# Patient Record
Sex: Male | Born: 1995 | Race: White | Hispanic: No | Marital: Single
Health system: Midwestern US, Community
[De-identification: ages and names within clinical notes are randomized; demographics above are authoritative.]

## PROBLEM LIST (undated history)

## (undated) DIAGNOSIS — S56429A Laceration of extensor muscle, fascia and tendon of unspecified finger at forearm level, initial encounter: Secondary | ICD-10-CM

## (undated) DIAGNOSIS — R0989 Other specified symptoms and signs involving the circulatory and respiratory systems: Secondary | ICD-10-CM

## (undated) DIAGNOSIS — R05 Cough: Secondary | ICD-10-CM

## (undated) DIAGNOSIS — S61209A Unspecified open wound of unspecified finger without damage to nail, initial encounter: Secondary | ICD-10-CM

---
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
## 2017-05-26 DIAGNOSIS — S61209A Unspecified open wound of unspecified finger without damage to nail, initial encounter: Secondary | ICD-10-CM

## 2017-05-26 HISTORY — DX: Unspecified open wound of unspecified finger without damage to nail, initial encounter: S61.209A

## 2017-06-22 ENCOUNTER — Other Ambulatory Visit: Payer: Self-pay

## 2017-06-22 ENCOUNTER — Other Ambulatory Visit: Payer: Self-pay | Admitting: Orthopedic Surgery

## 2017-06-22 ENCOUNTER — Encounter (HOSPITAL_BASED_OUTPATIENT_CLINIC_OR_DEPARTMENT_OTHER): Payer: Self-pay | Admitting: *Deleted

## 2017-06-22 DIAGNOSIS — R0989 Other specified symptoms and signs involving the circulatory and respiratory systems: Secondary | ICD-10-CM

## 2017-06-22 DIAGNOSIS — R059 Cough, unspecified: Secondary | ICD-10-CM

## 2017-06-22 HISTORY — DX: Cough, unspecified: R05.9

## 2017-06-22 HISTORY — DX: Other specified symptoms and signs involving the circulatory and respiratory systems: R09.89

## 2017-06-25 ENCOUNTER — Ambulatory Visit (HOSPITAL_BASED_OUTPATIENT_CLINIC_OR_DEPARTMENT_OTHER)
Admission: RE | Admit: 2017-06-25 | Discharge: 2017-06-25 | Disposition: A | Discharge status: Home / Self Care | Payer: BLUE CROSS/BLUE SHIELD | Source: Ambulatory Visit | Attending: Orthopedic Surgery | Admitting: Orthopedic Surgery

## 2017-06-25 ENCOUNTER — Ambulatory Visit (HOSPITAL_BASED_OUTPATIENT_CLINIC_OR_DEPARTMENT_OTHER): Payer: BLUE CROSS/BLUE SHIELD | Admitting: Anesthesiology

## 2017-06-25 ENCOUNTER — Other Ambulatory Visit: Payer: Self-pay

## 2017-06-25 ENCOUNTER — Encounter (HOSPITAL_BASED_OUTPATIENT_CLINIC_OR_DEPARTMENT_OTHER)
Admission: RE | Disposition: A | Discharge status: Home / Self Care | Payer: Self-pay | Source: Ambulatory Visit | Attending: Orthopedic Surgery

## 2017-06-25 ENCOUNTER — Encounter (HOSPITAL_BASED_OUTPATIENT_CLINIC_OR_DEPARTMENT_OTHER): Payer: Self-pay | Admitting: *Deleted

## 2017-06-25 DIAGNOSIS — F17210 Nicotine dependence, cigarettes, uncomplicated: Secondary | ICD-10-CM | POA: Insufficient documentation

## 2017-06-25 DIAGNOSIS — R05 Cough: Secondary | ICD-10-CM | POA: Diagnosis not present

## 2017-06-25 DIAGNOSIS — S66320A Laceration of extensor muscle, fascia and tendon of right index finger at wrist and hand level, initial encounter: Secondary | ICD-10-CM | POA: Diagnosis not present

## 2017-06-25 DIAGNOSIS — R0989 Other specified symptoms and signs involving the circulatory and respiratory systems: Secondary | ICD-10-CM | POA: Insufficient documentation

## 2017-06-25 DIAGNOSIS — X58XXXA Exposure to other specified factors, initial encounter: Secondary | ICD-10-CM | POA: Diagnosis not present

## 2017-06-25 DIAGNOSIS — S61210A Laceration without foreign body of right index finger without damage to nail, initial encounter: Secondary | ICD-10-CM | POA: Diagnosis present

## 2017-06-25 DIAGNOSIS — Y939 Activity, unspecified: Secondary | ICD-10-CM | POA: Diagnosis not present

## 2017-06-25 HISTORY — DX: Other specified symptoms and signs involving the circulatory and respiratory systems: R09.89

## 2017-06-25 HISTORY — DX: Cough: R05

## 2017-06-25 HISTORY — PX: REPAIR EXTENSOR TENDON: SHX5382

## 2017-06-25 HISTORY — DX: Unspecified open wound of unspecified finger without damage to nail, initial encounter: S61.209A

## 2017-06-25 HISTORY — DX: Laceration of extensor muscle, fascia and tendon of unspecified finger at forearm level, initial encounter: S56.429A

## 2017-06-25 SURGERY — REPAIR, TENDON, EXTENSOR
Anesthesia: Regional | Site: Finger | Laterality: Right

## 2017-06-25 MED ORDER — MIDAZOLAM HCL 2 MG/2ML IJ SOLN
INTRAMUSCULAR | Status: AC
Start: 2017-06-25 — End: ?
  Filled 2017-06-25: qty 2

## 2017-06-25 MED ORDER — MEPERIDINE HCL 25 MG/ML IJ SOLN: 6.2500 mg | INTRAMUSCULAR | Status: DC | PRN

## 2017-06-25 MED ORDER — ONDANSETRON HCL 4 MG/2ML IJ SOLN
INTRAMUSCULAR | Status: AC
Start: 2017-06-25 — End: ?
  Filled 2017-06-25: qty 2

## 2017-06-25 MED ORDER — ONDANSETRON HCL 4 MG/2ML IJ SOLN
INTRAMUSCULAR | Status: AC
  Filled 2017-06-25: qty 2

## 2017-06-25 MED ORDER — PROMETHAZINE HCL 25 MG/ML IJ SOLN: 6.2500 mg | INTRAMUSCULAR | Status: DC | PRN

## 2017-06-25 MED ORDER — PROPOFOL 10 MG/ML IV BOLUS
INTRAVENOUS | Status: DC | PRN
  Administered 2017-06-25: 40 mg via INTRAVENOUS

## 2017-06-25 MED ORDER — OXYCODONE HCL 5 MG PO TABS: 5.0000 mg | ORAL_TABLET | Freq: Once | ORAL | Status: DC | PRN

## 2017-06-25 MED ORDER — FENTANYL CITRATE (PF) 100 MCG/2ML IJ SOLN
INTRAMUSCULAR | Status: AC
  Filled 2017-06-25: qty 2

## 2017-06-25 MED ORDER — HYDROMORPHONE HCL 1 MG/ML IJ SOLN
0.2500 mg | INTRAMUSCULAR | Status: DC | PRN
Start: 2017-06-25 — End: 2017-06-25

## 2017-06-25 MED ORDER — LACTATED RINGERS IV SOLN
INTRAVENOUS | Status: DC
  Administered 2017-06-25: 14:00:00 via INTRAVENOUS

## 2017-06-25 MED ORDER — OXYCODONE HCL 5 MG/5ML PO SOLN: 5.0000 mg | Freq: Once | ORAL | Status: DC | PRN

## 2017-06-25 MED ORDER — DEXAMETHASONE SODIUM PHOSPHATE 10 MG/ML IJ SOLN
INTRAMUSCULAR | Status: AC
  Filled 2017-06-25: qty 1

## 2017-06-25 MED ORDER — CEFAZOLIN SODIUM-DEXTROSE 2-4 GM/100ML-% IV SOLN
INTRAVENOUS | Status: AC
  Filled 2017-06-25: qty 100

## 2017-06-25 MED ORDER — MIDAZOLAM HCL 2 MG/2ML IJ SOLN
1.0000 mg | INTRAMUSCULAR | Status: DC | PRN
  Administered 2017-06-25: 2 mg via INTRAVENOUS

## 2017-06-25 MED ORDER — LACTATED RINGERS IV SOLN: INTRAVENOUS | Status: DC

## 2017-06-25 MED ORDER — HYDROCODONE-ACETAMINOPHEN 5-325 MG PO TABS: 1.0000 | ORAL_TABLET | Freq: Four times a day (QID) | ORAL | 0 refills | Status: AC | PRN

## 2017-06-25 MED ORDER — LIDOCAINE HCL (PF) 0.5 % IJ SOLN
INTRAMUSCULAR | Status: DC | PRN
  Administered 2017-06-25: 30 mL via INTRAVENOUS

## 2017-06-25 MED ORDER — FENTANYL CITRATE (PF) 100 MCG/2ML IJ SOLN
50.0000 ug | INTRAMUSCULAR | Status: DC | PRN
  Administered 2017-06-25: 100 ug via INTRAVENOUS

## 2017-06-25 MED ORDER — CHLORHEXIDINE GLUCONATE 4 % EX LIQD: 60.0000 mL | Freq: Once | CUTANEOUS | Status: DC

## 2017-06-25 MED ORDER — BUPIVACAINE HCL (PF) 0.25 % IJ SOLN
INTRAMUSCULAR | Status: DC | PRN
  Administered 2017-06-25: 8 mL

## 2017-06-25 MED ORDER — PROPOFOL 500 MG/50ML IV EMUL
INTRAVENOUS | Status: DC | PRN
  Administered 2017-06-25: 100 ug/kg/min via INTRAVENOUS

## 2017-06-25 MED ORDER — PROPOFOL 10 MG/ML IV BOLUS
INTRAVENOUS | Status: AC
  Filled 2017-06-25: qty 20

## 2017-06-25 MED ORDER — SCOPOLAMINE 1 MG/3DAYS TD PT72: 1.0000 | MEDICATED_PATCH | Freq: Once | TRANSDERMAL | Status: DC | PRN

## 2017-06-25 MED ORDER — CEFAZOLIN SODIUM-DEXTROSE 2-4 GM/100ML-% IV SOLN
2.0000 g | INTRAVENOUS | Status: AC
  Administered 2017-06-25: 2 g via INTRAVENOUS

## 2017-06-25 SURGICAL SUPPLY — 69 items
BAG DECANTER FOR FLEXI CONT (MISCELLANEOUS) IMPLANT
BLADE MINI RND TIP GREEN BEAV (BLADE) IMPLANT
BLADE SURG 15 STRL LF DISP TIS (BLADE) ×1 IMPLANT
BLADE SURG 15 STRL SS (BLADE) ×1
BNDG COHESIVE 3X5 TAN STRL LF (GAUZE/BANDAGES/DRESSINGS) ×2 IMPLANT
BNDG ESMARK 4X9 LF (GAUZE/BANDAGES/DRESSINGS) IMPLANT
BNDG GAUZE ELAST 4 BULKY (GAUZE/BANDAGES/DRESSINGS) ×2 IMPLANT
CHLORAPREP W/TINT 26ML (MISCELLANEOUS) ×2 IMPLANT
CORD BIPOLAR FORCEPS 12FT (ELECTRODE) ×2 IMPLANT
COTTONBALL LRG STERILE PKG (GAUZE/BANDAGES/DRESSINGS) IMPLANT
COVER BACK TABLE 60X90IN (DRAPES) ×2 IMPLANT
COVER MAYO STAND STRL (DRAPES) ×2 IMPLANT
CUFF TOURNIQUET SINGLE 18IN (TOURNIQUET CUFF) ×2 IMPLANT
DECANTER SPIKE VIAL GLASS SM (MISCELLANEOUS) IMPLANT
DRAIN TLS ROUND 10FR (DRAIN) IMPLANT
DRAPE EXTREMITY T 121X128X90 (DRAPE) ×2 IMPLANT
DRAPE OEC MINIVIEW 54X84 (DRAPES) IMPLANT
DRAPE SURG 17X23 STRL (DRAPES) ×2 IMPLANT
GAUZE SPONGE 4X4 12PLY STRL (GAUZE/BANDAGES/DRESSINGS) ×2 IMPLANT
GAUZE SPONGE 4X4 16PLY XRAY LF (GAUZE/BANDAGES/DRESSINGS) IMPLANT
GAUZE XEROFORM 1X8 LF (GAUZE/BANDAGES/DRESSINGS) ×2 IMPLANT
GLOVE BIOGEL PI IND STRL 8.5 (GLOVE) ×1 IMPLANT
GLOVE BIOGEL PI INDICATOR 8.5 (GLOVE) ×1
GLOVE SURG ORTHO 8.0 STRL STRW (GLOVE) ×2 IMPLANT
GOWN STRL REUS W/ TWL LRG LVL3 (GOWN DISPOSABLE) ×1 IMPLANT
GOWN STRL REUS W/TWL LRG LVL3 (GOWN DISPOSABLE) ×1
GOWN STRL REUS W/TWL XL LVL3 (GOWN DISPOSABLE) ×2 IMPLANT
K-WIRE .035X4 (WIRE) IMPLANT
LOOP VESSEL MAXI BLUE (MISCELLANEOUS) IMPLANT
NEEDLE HYPO 22GX1.5 SAFETY (NEEDLE) IMPLANT
NEEDLE KEITH (NEEDLE) IMPLANT
NEEDLE PRECISIONGLIDE 27X1.5 (NEEDLE) ×2 IMPLANT
NS IRRIG 1000ML POUR BTL (IV SOLUTION) ×2 IMPLANT
PACK BASIN DAY SURGERY FS (CUSTOM PROCEDURE TRAY) ×2 IMPLANT
PAD CAST 3X4 CTTN HI CHSV (CAST SUPPLIES) ×1 IMPLANT
PADDING CAST ABS 3INX4YD NS (CAST SUPPLIES)
PADDING CAST ABS 4INX4YD NS (CAST SUPPLIES) ×1
PADDING CAST ABS COTTON 3X4 (CAST SUPPLIES) IMPLANT
PADDING CAST ABS COTTON 4X4 ST (CAST SUPPLIES) ×1 IMPLANT
PADDING CAST COTTON 3X4 STRL (CAST SUPPLIES) ×1
SLEEVE SCD COMPRESS KNEE MED (MISCELLANEOUS) IMPLANT
SPLINT PLASTER CAST XFAST 3X15 (CAST SUPPLIES) ×10 IMPLANT
SPLINT PLASTER XTRA FASTSET 3X (CAST SUPPLIES) ×10
STOCKINETTE 4X48 STRL (DRAPES) ×2 IMPLANT
SUT CHROMIC 5 0 P 3 (SUTURE) IMPLANT
SUT ETHIBOND 3-0 V-5 (SUTURE) IMPLANT
SUT ETHILON 4 0 PS 2 18 (SUTURE) ×2 IMPLANT
SUT ETHILON 5 0 PC 1 (SUTURE) ×2 IMPLANT
SUT FIBERWIRE 2-0 18 17.9 3/8 (SUTURE)
SUT FIBERWIRE 4-0 18 TAPR NDL (SUTURE)
SUT MERSILENE 2.0 SH NDLE (SUTURE) IMPLANT
SUT MERSILENE 3 0 FS 1 (SUTURE) IMPLANT
SUT MERSILENE 4 0 P 3 (SUTURE) ×2 IMPLANT
SUT PROLENE 2 0 SH DA (SUTURE) IMPLANT
SUT SILK 2 0 PERMA HAND 18 BK (SUTURE) IMPLANT
SUT SILK 4 0 PS 2 (SUTURE) IMPLANT
SUT STEEL 3 0 (SUTURE) IMPLANT
SUT VIC AB 3-0 PS1 18 (SUTURE)
SUT VIC AB 3-0 PS1 18XBRD (SUTURE) IMPLANT
SUT VIC AB 4-0 P-3 18XBRD (SUTURE) IMPLANT
SUT VIC AB 4-0 P3 18 (SUTURE)
SUT VICRYL 4-0 PS2 18IN ABS (SUTURE) IMPLANT
SUTURE FIBERWR 2-0 18 17.9 3/8 (SUTURE) IMPLANT
SUTURE FIBERWR 4-0 18 TAPR NDL (SUTURE) IMPLANT
SYR BULB 3OZ (MISCELLANEOUS) ×2 IMPLANT
SYR CONTROL 10ML LL (SYRINGE) ×2 IMPLANT
TOWEL OR 17X24 6PK STRL BLUE (TOWEL DISPOSABLE) ×4 IMPLANT
TUBE FEEDING ENTERAL 5FR 16IN (TUBING) IMPLANT
UNDERPAD 30X30 (UNDERPADS AND DIAPERS) ×2 IMPLANT

## 2017-06-25 NOTE — Brief Op Note (Signed)
06/25/2017  3:33 PM  PATIENT:  Tony Gould  21 y.o. male  PRE-OPERATIVE DIAGNOSIS:  laceration extensor tendon right index finger  POST-OPERATIVE DIAGNOSIS:  Laceration extensor tendon right index finger  PROCEDURE:  Procedure(s): REPAIR EXTENSOR TENDON RIGHT INDEX FINGER (Right)  SURGEON:  Surgeon(s) and Role:    Cindee Salt* Ela Moffat, MD - Primary  PHYSICIAN ASSISTANT:   ASSISTANTS: none   ANESTHESIA:   local, regional and IV sedation  EBL:  none  BLOOD ADMINISTERED:none  DRAINS: none   LOCAL MEDICATIONS USED:  BUPIVICAINE   SPECIMEN:  No Specimen  DISPOSITION OF SPECIMEN:  N/A  COUNTS:  YES  TOURNIQUET:   Total Tourniquet Time Documented: Forearm (Right) - 26 minutes Total: Forearm (Right) - 26 minutes   DICTATION: .Other Dictation: Dictation Number [id number]  PLAN OF CARE: Discharge to home after PACU  PATIENT DISPOSITION:  PACU - hemodynamically stable.

## 2017-06-25 NOTE — H&P (Signed)
  Tony Gould is an 21 y.o. male.   Chief Complaint:laceration right index finger HPI: Tony Gould is a 21 year old right-hand-dominant male referred by [facility] for consultation regarding a laceration he sustained to his right index finger. The injury occurred on Christmas night. He was seen in the emergency room and sutured. He was told he had an extensor tendon laceration was placed on Keflex and a pain medicine. He is complaining only of a minimal throbbing pain at the present time. He has been referred. Lacerations on the dorsal aspect of his finger from MP to PIP joint. He has no prior history of injury. He has no history of diabetes thyroid problems arthritis or gout. Family history is negative for each of these also.      Past Medical History:  Diagnosis Date  . Cough 06/22/2017  . Extensor tendon laceration of finger with open wound 05/2017   sutured - right index finger  . Runny nose 06/22/2017    History reviewed. No pertinent surgical history.  History reviewed. No pertinent family history. Social History:  reports that he has been smoking cigarettes.  He has smoked for the past 4.00 years. he has never used smokeless tobacco. He reports that he does not drink alcohol or use drugs.  Allergies: No Known Allergies  No medications prior to admission.    No results found for this or any previous visit (from the past 48 hour(s)).  No results found.   Pertinent items are noted in HPI.  Height 6' (1.829 m), weight 72.6 kg (160 lb).  General appearance: alert, cooperative and appears stated age Head: Normocephalic, without obvious abnormality Neck: no JVD Resp: clear to auscultation bilaterally Cardio: regular rate and rhythm, S1, S2 normal, no murmur, click, rub or gallop GI: soft, non-tender; bowel sounds normal; no masses,  no organomegaly Extremities: laceration right index finger Pulses: 2+ and symmetric Skin: Skin color, texture, turgor normal. No rashes  or lesions Neurologic: Grossly normal Incision/Wound: sutured  Assessment/Plan Assessment:  1. Laceration of right index finger with tendon involvement, initial encounter    Plan: Discussed with them exploration repair of the extensor tendon due to the length of the laceration. This will be scheduled as an outpatient under regional anesthesia. Be at hand at work for approximately 7 weeks. He will require splinting afterwards. Questions are encouraged and answered to their satisfaction. They are advised to maintain the Keflex at the present time. He is advised to use Tylenol and ibuprofen for pain at the present time.      Tony Gould 06/25/2017, 1:23 PM

## 2017-06-25 NOTE — Discharge Instructions (Addendum)
[facility] Instructions Hand Surgery  Wound Care: Keep your hand elevated above the level of your heart.  Do not allow it to dangle by your side.  Keep the dressing dry and do not remove it unless your doctor advises you to do so.  He will usually change it at the time of your post-op visit.  Moving your fingers is advised to stimulate circulation but will depend on the site of your surgery.  If you have a splint applied, your doctor will advise you regarding movement.  Activity: Do not drive or operate machinery today.  Rest today and then you may return to your normal activity and work as indicated by your physician.  Diet:  Drink liquids today or eat a light diet.  You may resume a regular diet tomorrow.    General expectations: Pain for two to three days. Fingers may become slightly swollen.  Call your doctor if any of the following occur: Severe pain not relieved by pain medication. Elevated temperature. Dressing soaked with blood. Inability to move fingers. White or bluish color to fingers.   Post Anesthesia Home Care Instructions  Activity: Get plenty of rest for the remainder of the day. A responsible individual must stay with you for 24 hours following the procedure.  For the next 24 hours, DO NOT: -Drive a car -Advertising copywriterperate machinery -Drink alcoholic beverages -Take any medication unless instructed by your physician -Make any legal decisions or sign important papers.  Meals: Start with liquid foods such as gelatin or soup. Progress to regular foods as tolerated. Avoid greasy, spicy, heavy foods. If nausea and/or vomiting occur, drink only clear liquids until the nausea and/or vomiting subsides. Call your physician if vomiting continues.  Special Instructions/Symptoms: Your throat may feel dry or sore from the anesthesia or the breathing tube placed in your throat during surgery. If this causes discomfort, gargle with warm salt water. The discomfort should disappear within  24 hours.  If you had a scopolamine patch placed behind your ear for the management of post- operative nausea and/or vomiting:  1. The medication in the patch is effective for 72 hours, after which it should be removed.  Wrap patch in a tissue and discard in the trash. Wash hands thoroughly with soap and water. 2. You may remove the patch earlier than 72 hours if you experience unpleasant side effects which may include dry mouth, dizziness or visual disturbances. 3. Avoid touching the patch. Wash your hands with soap and water after contact with the patch.        Excuse from Work  Please excuse Tony Gould from work/school beginning 06/25/2017.He  may then return to work with the following restrictions: no use right hand for 7 weeks.

## 2017-06-25 NOTE — Op Note (Signed)
Dictation Number [id number]

## 2017-06-25 NOTE — Anesthesia Preprocedure Evaluation (Signed)
Anesthesia Evaluation  Patient identified by MRN, date of birth, ID band Patient awake    Reviewed: Allergy & Precautions, NPO status , Patient's Chart, lab work & pertinent test results  Airway Mallampati: I  TM Distance: >3 FB Neck ROM: Full    Dental  (+) Teeth Intact, Dental Advisory Given   Pulmonary Current Smoker,    breath sounds clear to auscultation       Cardiovascular negative cardio ROS   Rhythm:Regular Rate:Normal     Neuro/Psych negative neurological ROS  negative psych ROS   GI/Hepatic negative GI ROS, Neg liver ROS,   Endo/Other  negative endocrine ROS  Renal/GU negative Renal ROS     Musculoskeletal negative musculoskeletal ROS (+)   Abdominal Normal abdominal exam  (+)   Peds  Hematology negative hematology ROS (+)   Anesthesia Other Findings   Reproductive/Obstetrics                             Anesthesia Physical Anesthesia Plan  ASA: I  Anesthesia Plan: Bier Block and Bier Block-LIDOCAINE ONLY   Post-op Pain Management:    Induction: Intravenous  PONV Risk Score and Plan: 1 and Ondansetron and Propofol infusion  Airway Management Planned: Natural Airway and Nasal Cannula  Additional Equipment: None  Intra-op Plan:   Post-operative Plan:   Informed Consent: I have reviewed the patients History and Physical, chart, labs and discussed the procedure including the risks, benefits and alternatives for the proposed anesthesia with the patient or authorized representative who has indicated his/her understanding and acceptance.   Dental advisory given  Plan Discussed with: CRNA  Anesthesia Plan Comments:         Anesthesia Quick Evaluation

## 2017-06-25 NOTE — Transfer of Care (Signed)
Immediate Anesthesia Transfer of Care Note  Patient: Tony Gould  Procedure(s) Performed: REPAIR EXTENSOR TENDON RIGHT INDEX FINGER (Right Finger)  Patient Location: PACU  Anesthesia Type:MAC  Level of Consciousness: awake and alert   Airway & Oxygen Therapy: Patient Spontanous Breathing and Patient connected to face mask oxygen  Post-op Assessment: Report given to RN and Post -op Vital signs reviewed and stable  Post vital signs: Reviewed and stable  Last Vitals:  Vitals:   06/25/17 1345  BP: 119/71  Pulse: (!) 52  Resp: 20  Temp: 36.5 C  SpO2: 100%    Last Pain:  Vitals:   06/25/17 1345  TempSrc: Oral  PainSc:          Complications: No apparent anesthesia complications

## 2017-06-25 NOTE — Anesthesia Procedure Notes (Signed)
Anesthesia Regional Block: Bier block (IV Regional)   Pre-Anesthetic Checklist: ,, timeout performed, Correct Patient, Correct Site, Correct Laterality, Correct Procedure,, site marked, surgical consent,, at surgeon's request Needles:  Injection technique: Single-shot  Needle Type: Other      Needle Gauge: 20     Additional Needles:   Procedures:,,,,, intact distal pulses, Esmarch exsanguination, single tourniquet utilized,  Narrative:  Start time: 06/25/2017 2:59 PM End time: 06/25/2017 3:00 PM  Performed by: Personally

## 2017-06-26 NOTE — Op Note (Signed)
[name]:  Kluesner, Omarri               ACCOUNT NO.:  [id number]  MEDICAL RECORD NO.:  [id number]  LOCATION:                                 FACILITY:  PHYSICIAN:  Cindee SaltGary Delana Manganello, M.D.            DATE OF BIRTH:  DATE OF PROCEDURE:  06/25/2017 DATE OF DISCHARGE:                              OPERATIVE REPORT   PREOPERATIVE DIAGNOSIS:  Laceration of extensor tendon, right index finger.  POSTOPERATIVE DIAGNOSIS:  Laceration of extensor tendon, right index finger.  OPERATION:  Repair of extensor tendon, left index finger with sharp debridement.  SURGEON:  Cindee SaltGary Zakry Caso, MD.  ASSISTANT:  None.  ANESTHESIA:  Forearm IV regional, IV sedation, local infiltration.  [facility]  ANESTHESIOLOGIST:  __________  HISTORY:  The patient is a 22 year old male, who sustained a laceration over the dorsal aspect of the right index finger with extensor tendon involvement.  He was originally seen in an outlying hospital were repair was performed to the skin and he was referred for definitive care.  He is aware that there is no guarantee to the surgery, the possibility of infection, recurrence of injury to arteries, nerves, and tendons, incomplete relief of symptoms, and dystrophy.  In the preoperative area, the patient is seen, the extremity marked by both patient and surgeon, antibiotic given.  DESCRIPTION OF PROCEDURE:  The patient was brought to the operating room where a forearm-based IV regional anesthetic was carried out without difficulty.  He was prepped using ChloraPrep in a supine position with the right arm free.  A time-out was taken confirming patient and procedure was done.  The sutures were removed and new instruments were used.  The area was opened.  The wound was debrided with sharp dissection using scalpel.  The extensor tendon laceration was found to cover the entire dorsal aspect of the index finger to the PIP joint. The area was copiously irrigated  with saline.  The periosteum appeared to be intact.  The extensor tendon was then repaired with a running 4-0 [place] sutures in a baseball manner, fully closing the wound over the entire proximal phalanx, PIP joint.  Wound was again copiously irrigated with saline.  The skin was then closed with interrupted 4-0 nylon sutures.  A metacarpal block was given with 0.25% bupivacaine without epinephrine, approximately 8 mL was used.  A sterile compressive dressing and volar splint to the index and middle finger including the wrist were applied.  This was done with the dorsiflexion of the wrist in mild flexion of the digit at the metacarpophalangeal joint with the PIP joint fully straight.  On deflation of the tourniquet, all fingers immediately pinked.  He was taken to the recovery room for observation in satisfactory condition. He will be discharged to home [facility] [place].          ______________________________ Cindee SaltGary Tadeo Besecker, M.D.     GK/MEDQ  D:  06/25/2017  T:  06/26/2017  Job:  [id number]

## 2017-06-27 ENCOUNTER — Encounter (HOSPITAL_BASED_OUTPATIENT_CLINIC_OR_DEPARTMENT_OTHER): Payer: Self-pay | Admitting: Orthopedic Surgery

## 2017-06-27 NOTE — Anesthesia Postprocedure Evaluation (Signed)
Anesthesia Post Note  Patient: Tony Gould  Procedure(s) Performed: REPAIR EXTENSOR TENDON RIGHT INDEX FINGER (Right Finger)     Patient location during evaluation: PACU Anesthesia Type: Bier Block Level of consciousness: awake and alert Pain management: pain level controlled Vital Signs Assessment: post-procedure vital signs reviewed and stable Respiratory status: spontaneous breathing, nonlabored ventilation, respiratory function stable and patient connected to nasal cannula oxygen Cardiovascular status: stable and blood pressure returned to baseline Postop Assessment: no apparent nausea or vomiting Anesthetic complications: no    Last Vitals:  Vitals:   06/25/17 1608 06/25/17 1631  BP:  117/63  Pulse: (!) 56 60  Resp: 14 16  Temp:  36.9 C  SpO2: 100% 100%    Last Pain:  Vitals:   06/25/17 1631  TempSrc: Oral  PainSc: 0-No pain                 Shelton SilvasKevin D Laria Grimmett

## 2017-07-01 ENCOUNTER — Other Ambulatory Visit: Payer: Self-pay

## 2017-07-01 ENCOUNTER — Emergency Department (HOSPITAL_COMMUNITY)
Admission: EM | Admit: 2017-07-01 | Discharge: 2017-07-01 | Disposition: A | Discharge status: Home / Self Care | Payer: BLUE CROSS/BLUE SHIELD | Attending: Emergency Medicine | Admitting: Emergency Medicine

## 2017-07-01 ENCOUNTER — Encounter (HOSPITAL_COMMUNITY): Payer: Self-pay | Admitting: Emergency Medicine

## 2017-07-01 DIAGNOSIS — F17210 Nicotine dependence, cigarettes, uncomplicated: Secondary | ICD-10-CM | POA: Diagnosis not present

## 2017-07-01 DIAGNOSIS — F329 Major depressive disorder, single episode, unspecified: Secondary | ICD-10-CM | POA: Diagnosis not present

## 2017-07-01 DIAGNOSIS — R45851 Suicidal ideations: Secondary | ICD-10-CM | POA: Insufficient documentation

## 2017-07-01 LAB — COMPREHENSIVE METABOLIC PANEL
ALBUMIN: 4.6 g/dL (ref 3.5–5.0)
ALK PHOS: 79 U/L (ref 38–126)
ALT: 15 U/L — ABNORMAL LOW (ref 17–63)
AST: 20 U/L (ref 15–41)
Anion gap: 9 (ref 5–15)
BILIRUBIN TOTAL: 0.6 mg/dL (ref 0.3–1.2)
BUN: 15 mg/dL (ref 6–20)
CALCIUM: 9.9 mg/dL (ref 8.9–10.3)
CO2: 25 mmol/L (ref 22–32)
Chloride: 110 mmol/L (ref 101–111)
Creatinine, Ser: 0.92 mg/dL (ref 0.61–1.24)
GFR calc Af Amer: >60 mL/min (ref >60)
Glucose, Bld: 96 mg/dL (ref 65–99)
POTASSIUM: 3.8 mmol/L (ref 3.5–5.1)
Sodium: 144 mmol/L (ref 135–145)
TOTAL PROTEIN: 7.7 g/dL (ref 6.5–8.1)

## 2017-07-01 LAB — ETHANOL

## 2017-07-01 LAB — RAPID URINE DRUG SCREEN, HOSP PERFORMED
Amphetamines: NOT DETECTED
BARBITURATES: NOT DETECTED
Benzodiazepines: NOT DETECTED
Cocaine: NOT DETECTED
Opiates: NOT DETECTED
Tetrahydrocannabinol: NOT DETECTED

## 2017-07-01 LAB — ACETAMINOPHEN LEVEL: Acetaminophen (Tylenol), Serum: <10 ug/mL — ABNORMAL LOW (ref 10–30)

## 2017-07-01 LAB — CBC
HEMATOCRIT: 43.1 % (ref 39.0–52.0)
Hemoglobin: 14.7 g/dL (ref 13.0–17.0)
MCH: 29.3 pg (ref 26.0–34.0)
MCHC: 34.1 g/dL (ref 30.0–36.0)
MCV: 86 fL (ref 78.0–100.0)
PLATELETS: 234 10*3/uL (ref 150–400)
RBC: 5.01 MIL/uL (ref 4.22–5.81)
RDW: 13.3 % (ref 11.5–15.5)
WBC: 8.7 10*3/uL (ref 4.0–10.5)

## 2017-07-01 LAB — SALICYLATE LEVEL: Salicylate Lvl: <7 mg/dL (ref 2.8–30.0)

## 2017-07-01 NOTE — ED Triage Notes (Signed)
Pt c/o of being depressed today.  Pt states he fiance left him on the side of the road yesterday and just recently lost his job.  Pt went to a friends house seeking help and friends called police due to pt having suicidal thoughts. Denies SI/HI at this time.  Pt very tearful.  Pt states he is very depressed..Marland Kitchen

## 2017-07-01 NOTE — Discharge Instructions (Signed)
Follow up with [facility] °

## 2017-07-01 NOTE — ED Notes (Signed)
Pt escorted to family room, away from hallway bed, to do TTS consult in private, accompanied by sitter.

## 2017-07-01 NOTE — BH Assessment (Addendum)
Tele Assessment Note   Patient Name: Tony Gould MRN: [id number] Referring PhysClearnce Sorrelician: Estell HarpinZammit Location of Patient: [facility] Location of Provider: Behavioral Health TTS Department  Tony SorrelBradley Staszak is an 22 y.o. male who presented in the ED via police because of suicidal ideation. Friends that he is residing will called the police to get him some help. Patient states that he is very depressed and has been having suicidal thoughts, but could not identify a plan of how he would try to harm himself and he states that he has no past history of any thoughts.  Patient states that he injured a tendon on his right hand and states that he lost his job because he was working with one hand and states that he was told he was working too slowly. He states that the loss of his job created financial problems and he states that they are expecting their first child together.  He states that they were arguing over finances and she put him out on the side of the road and he states that he is stating with friends.   Patient is alert and oriented, but has a flat affect. However, he could not identify any depressive symptoms other than just saying that he was depressed.  He appears to be of average intelligence. His conversation is coherent and logical. He states that his sleep and appetite are good.  Patient states that he has no history of any mental illness or prior treatment.  He denies HI/Psychosis.  Patient states that he has no history of any substance abuse other than nicotine.  Patient states that he has been on his own since he was 43sixteen years old.  He states that his parents threw him out of the house, but would not identify the reasons that they did so.  He states that he has not seen them since he left home and states that his girlfriend is the only support that he has. Patient states that he has five sisters and two brothers, but states that he has no contact with them either.  Patient states that he has no history  of legal involvement  When asked if he feels like he needs to be in the hospital for his depression, patient states, "no, I just came here to get some medications for my depression."  He states that he is able to contract for safety to return home with his friends.    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  Past Medical History:  Past Medical History:  Diagnosis Date  . Cough 06/22/2017  . Extensor tendon laceration of finger with open wound 05/2017   sutured - right index finger  . Runny nose 06/22/2017    Past Surgical History:  Procedure Laterality Date  . REPAIR EXTENSOR TENDON Right 06/25/2017   Procedure: REPAIR EXTENSOR TENDON RIGHT INDEX FINGER;  Surgeon: Cindee SaltKuzma, Gary, MD;  Location: [facility];  Service: Orthopedics;  Laterality: Right;    Family History: History reviewed. No pertinent family history.  Social History:  reports that he has been smoking cigarettes.  He has smoked for the past 4.00 years. he has never used smokeless tobacco. He reports that he does not drink alcohol or use drugs.  Additional Social History:  Alcohol / Drug Use Pain Medications: denies Prescriptions: denies Over the Counter: denies History of alcohol / drug use?: No history of alcohol / drug abuse  CIWA: CIWA-Ar BP: (!) 121/94 Pulse Rate: 92 COWS:    PATIENT  STRENGTHS: (choose at least two) Average or above average intelligence Communication skills Physical Health  Allergies: No Known Allergies  Home Medications:  (Not in a hospital admission)  OB/GYN Status:  No LMP for male patient.  General Assessment Data Location of Assessment: [facility] TTS Assessment: In system Is this a Tele or Face-to-Face Assessment?: Tele Assessment Is this an Initial Assessment or a Re-assessment for this encounter?: Initial Assessment Marital status: Single Living Arrangements: Spouse/significant other Can pt return to current living  arrangement?: No Admission Status: Voluntary Is patient capable of signing voluntary admission?: Yes Referral Source: MD Insurance type: Biomedical scientist)     Crisis Care Plan Living Arrangements: Spouse/significant other Legal Guardian: Other:(self) Name of Psychiatrist: (none) Name of Therapist: (none)  Education Status Is patient currently in school?: No Highest grade of school patient has completed: (GED) Name of school: [employer] Contact person: NA  Risk to self with the past 6 months Suicidal Ideation: Yes-Currently Present Has patient been a risk to self within the past 6 months prior to admission? : No Suicidal Intent: No Has patient had any suicidal intent within the past 6 months prior to admission? : No Is patient at risk for suicide?: Yes Suicidal Plan?: No Has patient had any suicidal plan within the past 6 months prior to admission? : No Access to Means: No What has been your use of drugs/alcohol within the last 12 months?: (none) Previous Attempts/Gestures: No How many times?: (none) Other Self Harm Risks: (loss of job, girlfriend and fianacial problems) Triggers for Past Attempts: None known Intentional Self Injurious Behavior: None Family Suicide History: No Recent stressful life event(s): Job Loss, Surveyor, quantity Problems, Turmoil (Comment), Other (Comment)(girlfriend left him) Persecutory voices/beliefs?: No Depression: Yes Depression Symptoms: Despondent, Loss of interest in usual pleasures, Feeling worthless/self pity Substance abuse history and/or treatment for substance abuse?: No  Risk to Others within the past 6 months Homicidal Ideation: No Does patient have any lifetime risk of violence toward others beyond the six months prior to admission? : No Thoughts of Harm to Others: No Current Homicidal Intent: No Current Homicidal Plan: No Access to Homicidal Means: No Identified Victim: (none) History of harm to others?: No Assessment of Violence: None  Noted Does patient have access to weapons?: No Criminal Charges Pending?: No Does patient have a court date: No Is patient on probation?: No  Psychosis Hallucinations: None noted Delusions: None noted  Mental Status Report Appearance/Hygiene: Unremarkable Eye Contact: Good Motor Activity: Unremarkable Speech: Unremarkable Level of Consciousness: Alert Mood: Depressed, Apathetic, Sad Affect: Anxious, Depressed Anxiety Level: Moderate Thought Processes: Coherent, Relevant Judgement: Unimpaired Orientation: Person, Place, Time, Situation Obsessive Compulsive Thoughts/Behaviors: Minimal  Cognitive Functioning Concentration: Normal Memory: Recent Intact, Remote Intact IQ: Average Insight: Fair Impulse Control: Fair Appetite: Good Weight Loss: (none) Weight Gain: (none) Sleep: No Change Total Hours of Sleep: (6) Vegetative Symptoms: None  ADLScreening [facility] Assessment Services) Patient's cognitive ability adequate to safely complete daily activities?: Yes Patient able to express need for assistance with ADLs?: Yes Independently performs ADLs?: Yes (appropriate for developmental age)  Prior Inpatient Therapy Prior Inpatient Therapy: No  Prior Outpatient Therapy Prior Outpatient Therapy: No Does patient have an ACCT team?: No Does patient have Intensive In-House Services?  : No Does patient have [facility] services? : No Does patient have P4CC services?: No  ADL Screening (condition at time of admission) Patient's cognitive ability adequate to safely complete daily activities?: Yes Is the patient deaf or have difficulty hearing?: No Does the  patient have difficulty seeing, even when wearing glasses/contacts?: No Does the patient have difficulty concentrating, remembering, or making decisions?: No Patient able to express need for assistance with ADLs?: Yes Does the patient have difficulty dressing or bathing?: No Independently performs ADLs?: Yes (appropriate for  developmental age) Does the patient have difficulty walking or climbing stairs?: No Weakness of Legs: None Weakness of Arms/Hands: None       Abuse/Neglect Assessment (Assessment to be complete while patient is alone) Abuse/Neglect Assessment Can Be Completed: Yes Physical Abuse: Denies Verbal Abuse: Denies Sexual Abuse: Denies Exploitation of patient/patient's resources: Denies Self-Neglect: Denies Values / Beliefs Cultural Requests During Hospitalization: None Spiritual Requests During Hospitalization: None Consults Spiritual Care Consult Needed: No Social Work Consult Needed: No Merchant navy officer (For Healthcare) Does Patient Have a Medical Advance Directive?: No Would patient like information on creating a medical advance directive?: No - Patient declined    Additional Information 1:1 In Past 12 Months?: No CIRT Risk: No Elopement Risk: No Does patient have medical clearance?: Yes  Child/Adolescent Assessment Running Away Risk: Denies Bed-Wetting: Denies Destruction of Property: Denies Cruelty to Animals: Denies Stealing: Denies Rebellious/Defies Authority: Denies Satanic Involvement: Denies Archivist: Denies Problems at [employer]: Denies Gang Involvement: Denies  Disposition: Per Claudette Head, NP, Patient is not committable, but does meet admission criteria for a voluntary psych admission. Informed Dr. Estell Harpin of disposition. Disposition Initial Assessment Completed for this Encounter: Yes Disposition of Patient: Pending Review with psychiatrist  This service was provided via telemedicine using a 2-way, interactive audio and video technology.  Names of all persons participating in this telemedicine service and their role in this encounter. Name: Tony Gould Role: Patient  Name: Josephina Gip, MA, LCAS Role: TTS   Name:  Role:   Name:  Role:    Daphene Calamity 07/01/2017 3:07 PM

## 2017-07-01 NOTE — ED Notes (Signed)
Pt went to family room with sitter to have TTS talk to patient.

## 2017-07-02 NOTE — ED Provider Notes (Signed)
[facility] EMERGENCY DEPARTMENT Provider Note   CSN: [id number] Arrival date & time: 07/01/17  1329     History   Chief Complaint Chief Complaint  Patient presents with  . Suicidal    HPI Tony Gould is a 22 y.o. male.  Patient states that he has been depressed because he lost his job his house and his girlfriend.  He had been talking about hurting himself.  He states now that he was just talking he really does not want to hurt himself.  And he wants to go home   The history is provided by the patient.  Altered Mental Status   This is a new problem. The current episode started more than 2 days ago. The problem has been gradually improving. Pertinent negatives include no confusion, no seizures and no hallucinations. Risk factors: Unknown. His past medical history does not include seizures.    Past Medical History:  Diagnosis Date  . Cough 06/22/2017  . Extensor tendon laceration of finger with open wound 05/2017   sutured - right index finger  . Runny nose 06/22/2017    There are no active problems to display for this patient.   Past Surgical History:  Procedure Laterality Date  . REPAIR EXTENSOR TENDON Right 06/25/2017   Procedure: REPAIR EXTENSOR TENDON RIGHT INDEX FINGER;  Surgeon: Cindee Salt, MD;  Location: [facility];  Service: Orthopedics;  Laterality: Right;       Home Medications    Prior to Admission medications   Medication Sig Start Date End Date Taking? Authorizing Provider  cephALEXin (KEFLEX) 500 MG capsule Take 500 mg by mouth 3 (three) times daily.    [provider]  HYDROcodone-acetaminophen (NORCO) 5-325 MG tablet Take 1 tablet by mouth every 6 (six) hours as needed. 06/25/17   Cindee Salt, MD  ibuprofen (ADVIL,MOTRIN) 800 MG tablet Take 800 mg by mouth every 8 (eight) hours as needed.    [provider]    Family History History reviewed. No pertinent family history.  Social History Social History    Tobacco Use  . Smoking status: Current Every Day Smoker    Years: 4.00    Types: Cigarettes  . Smokeless tobacco: Never Used  . Tobacco comment: 6 cig./day  Substance Use Topics  . Alcohol use: No  . Drug use: No     Allergies   Patient has no known allergies.   Review of Systems Review of Systems  Constitutional: Negative for appetite change and fatigue.  HENT: Negative for congestion, ear discharge and sinus pressure.   Eyes: Negative for discharge.  Respiratory: Negative for cough.   Cardiovascular: Negative for chest pain.  Gastrointestinal: Negative for abdominal pain and diarrhea.  Genitourinary: Negative for frequency and hematuria.  Musculoskeletal: Negative for back pain.  Skin: Negative for rash.  Neurological: Negative for seizures and headaches.  Psychiatric/Behavioral: Positive for dysphoric mood. Negative for confusion and hallucinations.     Physical Exam Updated Vital Signs BP 121/68 (BP Location: Left Arm)   Pulse 89   Temp 98.2 F (36.8 C) (Oral)   Resp 18   Ht 6' (1.829 m)   Wt 68 kg (150 lb)   SpO2 97%   BMI 20.34 kg/m   Physical Exam  Constitutional: He is oriented to person, place, and time. He appears well-developed.  HENT:  Head: Normocephalic.  Eyes: Conjunctivae and EOM are normal. No scleral icterus.  Neck: Neck supple. No thyromegaly present.  Cardiovascular: Normal rate and regular rhythm.  Exam reveals no gallop and no friction rub.  No murmur heard. Pulmonary/Chest: No stridor. He has no wheezes. He has no rales. He exhibits no tenderness.  Abdominal: He exhibits no distension. There is no tenderness. There is no rebound.  Musculoskeletal: Normal range of motion. He exhibits no edema.  Lymphadenopathy:    He has no cervical adenopathy.  Neurological: He is oriented to person, place, and time. He exhibits normal muscle tone. Coordination normal.  Skin: No rash noted. No erythema.  Psychiatric:  Patient depressed but not  suicidal     ED Treatments / Results  Labs (all labs ordered are listed, but only abnormal results are displayed) Labs Reviewed  COMPREHENSIVE METABOLIC PANEL - Abnormal; Notable for the following components:      Result Value   ALT 15 (*)    All other components within normal limits  ACETAMINOPHEN LEVEL - Abnormal; Notable for the following components:   Acetaminophen (Tylenol), Serum <10 (*)    All other components within normal limits  ETHANOL  SALICYLATE LEVEL  CBC  RAPID URINE DRUG SCREEN, HOSP PERFORMED    EKG  EKG Interpretation None       Radiology No results found.  Procedures Procedures (including critical care time)  Medications Ordered in ED Medications - No data to display   Initial Impression / Assessment and Plan / ED Course  I have reviewed the triage vital signs and the nursing notes.  Pertinent labs & imaging results that were available during my care of the patient were reviewed by me and considered in my medical decision making (see chart for details).    Patient was seen by behavioral health and recommended admission to the hospital for depression.  They did not feel like the patient warranted being committed for suicidal ideation.  The patient stated he prefer outpatient treatment so is discharged home and referred to [facility]  Final Clinical Impressions(s) / ED Diagnoses   Final diagnoses:  Suicidal ideation    ED Discharge Orders    None       Bethann BerkshireZammit, Maryfrances Portugal, MD 07/02/17 [id number]

## 2019-03-13 ENCOUNTER — Inpatient Hospital Stay
Admit: 2019-03-13 | Discharge: 2019-03-13 | Disposition: A | Discharge status: Home / Self Care | Payer: MEDICAID | Attending: Emergency Medicine

## 2019-03-13 DIAGNOSIS — S51811A Laceration without foreign body of right forearm, initial encounter: Secondary | ICD-10-CM

## 2019-03-13 MED ORDER — LIDOCAINE-EPINEPHRINE 2 %-1:100,000 IJ SOLN
2 %-1:100,000 | Freq: Once | INTRAMUSCULAR | Status: DC
Start: 2019-03-13 — End: 2019-03-13

## 2019-03-13 MED ORDER — LIDOCAINE HCL 1 % (10 MG/ML) IJ SOLN
10 mg/mL (1 %) | Freq: Once | INTRAMUSCULAR | Status: AC
Start: 2019-03-13 — End: 2019-03-13
  Administered 2019-03-13: 16:00:00 via INTRADERMAL

## 2019-03-13 MED ORDER — BACITRACIN 500 UNIT/G TOPICAL PACKET
500 unit/gram | CUTANEOUS | Status: AC
Start: 2019-03-13 — End: 2019-03-13
  Administered 2019-03-13: 17:00:00 via TOPICAL

## 2019-03-13 MED FILL — XYLOCAINE 10 MG/ML (1 %) INJECTION SOLUTION: 10 mg/mL (1 %) | INTRAMUSCULAR | Qty: 20

## 2019-03-13 MED FILL — BACITRACIN 500 UNIT/G TOPICAL PACKET: 500 unit/gram | CUTANEOUS | Qty: 1

## 2019-03-13 NOTE — ED Notes (Signed)
Pt discharged c/o 0/10 pain.  Dressing applied PTD

## 2019-03-13 NOTE — ED Triage Notes (Signed)
Cut right FA on roofing that is tin.  Controlled bleeding with paper towels.  UTD on Tdap

## 2019-03-13 NOTE — ED Provider Notes (Signed)
EMERGENCY DEPARTMENT HISTORY AND PHYSICAL EXAM      Date: 03/13/2019  Patient Name: James Lewis    History of Presenting Illness     Chief Complaint   Patient presents with   ??? Laceration       History Provided By: Patient    HPI: James Lewis, 23 y.o. male with no significant past medical history presents to the ED with cc of laceration to R forearm. Pt states he was working on a roof this morning when he had lacerated his R forearm on a tin roof. Pt currently c/o pain and bleeding, with decreased ROM due to pain. Pt denies LOC or any other sxs.    There are no other complaints, changes, or physical findings at this time.    PCP: Alma Downs, Not On File        Past History     Past Medical History:  History reviewed. No pertinent past medical history.    Past Surgical History:  Past Surgical History:   Procedure Laterality Date   ??? IR INJ TENDON SHEATH/LIGA SNGL         Family History:  History reviewed. No pertinent family history.    Social History:  Social History     Tobacco Use   ??? Smoking status: Current Every Day Smoker     Packs/day: 1.00   ??? Smokeless tobacco: Current User   Substance Use Topics   ??? Alcohol use: Yes     Comment: socially   ??? Drug use: Never       Allergies:  No Known Allergies      Review of Systems   **  Review of Systems   Constitutional: Negative.    HENT: Negative.    Eyes: Negative.    Respiratory: Negative.    Cardiovascular: Negative.    Gastrointestinal: Negative.    Endocrine: Negative.    Genitourinary: Negative.    Musculoskeletal:        Laceration to palmar aspect of R forearm, pain, bleeding   Allergic/Immunologic: Negative.    Neurological: Negative.    Hematological: Negative.    Psychiatric/Behavioral: Negative.        Physical Exam   **  Physical Exam  Constitutional:       Appearance: Normal appearance. He is normal weight.   HENT:      Head: Normocephalic and atraumatic.   Eyes:      Extraocular Movements: Extraocular movements intact.       Pupils: Pupils are equal, round, and reactive to light.   Neck:      Musculoskeletal: Normal range of motion and neck supple.   Cardiovascular:      Rate and Rhythm: Normal rate and regular rhythm.      Pulses: Normal pulses.      Heart sounds: Normal heart sounds.   Pulmonary:      Effort: Pulmonary effort is normal.      Breath sounds: Normal breath sounds.   Abdominal:      General: Abdomen is flat.      Palpations: Abdomen is soft.   Musculoskeletal:      Comments: 3 cm laceration to palmar aspect of R forearm   Skin:     General: Skin is warm and dry.   Neurological:      General: No focal deficit present.      Mental Status: He is alert and oriented to person, place, and time.   Psychiatric:  Mood and Affect: Mood normal.         Behavior: Behavior normal.         Thought Content: Thought content normal.         Judgment: Judgment normal.         Diagnostic Study Results     Labs -   No results found for this or any previous visit (from the past 12 hour(s)).    Radiologic Studies -   CT Results  (Last 48 hours)    None        CXR Results  (Last 48 hours)    None          Medical Decision Making and ED Course   I am the first provider for this patient.    I reviewed the vital signs, available nursing notes, past medical history, past surgical history, family history and social history.    Vital Signs-Reviewed the patient's vital signs.  Patient Vitals for the past 12 hrs:   Temp Pulse Resp BP SpO2   03/13/19 1309 ??? 72 18 118/72 100 %   03/13/19 1103 99.4 ??F (37.4 ??C) 80 18 125/69 98 %            Procedures       Ernestina PatchesHumberto F Ralph Brouwer, MD     Wound Repair    Date/Time: 03/13/2019 11:41 AM  Performed by: attendingPreparation: skin prepped with Shur-Clens  Location details: right arm  Wound length:2.6 - 7.5 cm  Anesthesia: local infiltration    Anesthesia:  Local Anesthetic: lidocaine 1% with epinephrine  Anesthetic total: 3 mL  Foreign bodies: no foreign bodies  Skin closure: 5-0 nylon   Number of sutures: 3  Technique: simple  Patient tolerance: Patient tolerated the procedure well with no immediate complications  My total time at bedside, performing this procedure was 16-30 minutes.        Ernestina PatchesHumberto F Mykaela Arena, MD        Disposition         DISCHARGE PLAN:  1. There are no discharge medications for this patient.    2.   Follow-up Information     Follow up With Specialties Details Why Contact Info    [facility], Not On File   For suture removal Not On File (58) Patient has a PCP but that physician is not listed in [facility].          3.  Return to ED if worse     Diagnosis     Clinical Impression:   1. Laceration of right upper extremity, initial encounter        Attestations:   By signing my name below, I, Hedy CamaraMelissa Webb, attest that this documentation has been prepared under the direction and in presence of Dr. Amalia GreenhouseHumberto Brysten Reister on 03/13/2019. Electronically signed: Hedy CamaraMelissa Webb, 03/13/2019 11:19 AM    Ernestina PatchesHumberto F Thinh Cuccaro, MD    Please note that this dictation was completed with Dragon, the computer voice recognition software.  Quite often unanticipated grammatical, syntax, homophones, and other interpretive errors are inadvertently transcribed by the computer software.  Please disregard these errors.  Please excuse any errors that have escaped final proofreading.  Thank you.

## 2019-03-13 NOTE — ED Provider Notes (Signed)
ED Provider Notes by Ernestina Patches, MD at 03/13/19 1116                Author: Ernestina Patches, MD  Service: EMERGENCY  Author Type: Physician       Filed: 03/13/19 1315  Date of Service: 03/13/19 1116  Status: Signed          Editor: Ernestina Patches, MD (Physician)            Procedure Orders        1. Wound Repair [[id number]] ordered by Ernestina Patches, MD                              EMERGENCY DEPARTMENT HISTORY AND PHYSICAL EXAM           Date: 03/13/2019   Patient Name: James Lewis        History of Presenting Illness          Chief Complaint       Patient presents with        ?  Laceration           History Provided By: Patient      HPI: James Lewis,  23 y.o. male with no significant past medical history presents to the ED with cc of laceration  to R forearm. Pt states he was working on a roof this morning when he had lacerated his R forearm on a tin roof. Pt currently c/o pain and bleeding, with decreased ROM due to pain. Pt denies LOC or any other sxs.      There are no other complaints, changes, or physical findings at this time.      PCP: Smith Mince, Not On File              Past History        Past Medical History:   History reviewed. No pertinent past medical history.      Past Surgical History:     Past Surgical History:         Procedure  Laterality  Date          ?  IR INJ TENDON SHEATH/LIGA SNGL               Family History:   History reviewed. No pertinent family history.      Social History:     Social History          Tobacco Use         ?  Smoking status:  Current Every Day Smoker              Packs/day:  1.00         ?  Smokeless tobacco:  Current User       Substance Use Topics         ?  Alcohol use:  Yes             Comment: socially         ?  Drug use:  Never           Allergies:   No Known Allergies           Review of Systems     **   Review of Systems    Constitutional: Negative.     HENT: Negative.     Eyes: Negative.     Respiratory: Negative.  Cardiovascular: Negative.     Gastrointestinal: Negative.     Endocrine: Negative.     Genitourinary: Negative.     Musculoskeletal:         Laceration to palmar aspect of R forearm, pain, bleeding    Allergic/Immunologic: Negative.     Neurological: Negative.     Hematological: Negative.     Psychiatric/Behavioral: Negative.             Physical Exam     **   Physical Exam   Constitutional :        Appearance: Normal appearance. He is normal weight.   HENT :       Head: Normocephalic and atraumatic.   Eyes :       Extraocular Movements: Extraocular movements intact.      Pupils: Pupils are equal, round, and reactive to light.    Neck:       Musculoskeletal: Normal range of motion and neck supple.   Cardiovascular :       Rate and Rhythm: Normal rate and regular rhythm.      Pulses: Normal pulses.      Heart sounds: Normal heart sounds.    Pulmonary:       Effort: Pulmonary effort is normal.      Breath sounds: Normal breath sounds.   Abdominal :      General: Abdomen is flat.      Palpations: Abdomen is soft.     Musculoskeletal:      Comments: 3 cm laceration to palmar aspect of R forearm     Skin:      General: Skin is warm and dry.   Neurological :       General: No focal deficit present.      Mental Status: He is alert and oriented to person, place, and time.    Psychiatric:         Mood and Affect: Mood normal.         Behavior: Behavior normal.         Thought Content: Thought content normal.         Judgment: Judgment normal.               Diagnostic Study Results        Labs -    No results found for this or any previous visit (from the past 12 hour(s)).      Radiologic Studies -      CT Results   (Last 48 hours)          None                 CXR Results   (Last 48 hours)          None                    Medical Decision Making and ED Course     I am the first provider for this patient.      I reviewed the vital signs, available nursing notes, past medical history, past surgical history, family history  and social history.      Vital Signs-Reviewed the patient's vital signs.   Patient Vitals for the past 12 hrs:            Temp  Pulse  Resp  BP  SpO2            03/13/19 1309  --  72  18  118/72  100 %            03/13/19 1103  99.4 ??F (37.4 ??C)  80  18  125/69  98 %                    Procedures           Ernestina PatchesHumberto F Noelia Lenart, MD       Wound Repair     Date/Time: 03/13/2019 11:41 AM   Performed by: attendingPreparation: skin prepped with Shur-Clens   Location details: right arm  Wound length:2.6 - 7.5 cm  Anesthesia:  local infiltration     Anesthesia:   Local Anesthetic: lidocaine 1% with epinephrine  Anesthetic total: 3  mL  Foreign bodies: no foreign bodies  Skin closure: 5-0 nylon  Number of sutures: 3  Technique: simple  Patient tolerance: Patient tolerated the procedure well with no immediate  complications  My total time at bedside, performing this procedure was 16-30 minutes.           Ernestina PatchesHumberto F Johnhenry Tippin, MD              Disposition              DISCHARGE PLAN:   1. There are no discharge medications for this patient.      2.      Follow-up Information               Follow up With  Specialties  Details  Why  Contact Info              [facility], Not On File      For suture removal  Not On File (58) Patient has a PCP but that physician is not listed in [facility].                3.  Return to ED if worse         Diagnosis        Clinical Impression:       1.  Laceration of right upper extremity, initial encounter            Attestations:    By signing my name below, I, Hedy CamaraMelissa Webb, attest that this documentation has been prepared under the direction and in presence of Dr. Amalia GreenhouseHumberto Becka Lagasse on 03/13/2019 . Electronically signed: Hedy CamaraMelissa Webb, 03/13/2019 11:19 AM      Ernestina PatchesHumberto F Asser Lucena, MD      Please note that this dictation was completed with Dragon, the computer voice recognition software.  Quite often unanticipated grammatical, syntax, homophones, and other interpretive errors are inadvertently   transcribed by the computer software.  Please disregard these errors.  Please excuse any errors that have escaped final proofreading.  Thank you.

## 2019-03-13 NOTE — ED Notes (Signed)
Cut right FA on roofing that is tin.  Controlled bleeding with paper towels.  UTD on Tdap

## 2019-03-13 NOTE — ED Notes (Signed)
Pt discharged c/o 0/10 pain.  Dressing applied PTD
